# Patient Record
Sex: Female | Born: 1995 | Race: White | Hispanic: No | Marital: Single | Smoking: Never smoker
Health system: Southern US, Community
[De-identification: ages and names within clinical notes are randomized; demographics above are authoritative.]

## PROBLEM LIST (undated history)

## (undated) DIAGNOSIS — R519 Headache, unspecified: Secondary | ICD-10-CM

## (undated) DIAGNOSIS — Z8669 Personal history of other diseases of the nervous system and sense organs: Secondary | ICD-10-CM

## (undated) DIAGNOSIS — E119 Type 2 diabetes mellitus without complications: Secondary | ICD-10-CM

## (undated) DIAGNOSIS — Z87440 Personal history of urinary (tract) infections: Secondary | ICD-10-CM

## (undated) DIAGNOSIS — Z8619 Personal history of other infectious and parasitic diseases: Secondary | ICD-10-CM

## (undated) DIAGNOSIS — F32A Depression, unspecified: Secondary | ICD-10-CM

## (undated) DIAGNOSIS — R51 Headache: Secondary | ICD-10-CM

## (undated) DIAGNOSIS — F329 Major depressive disorder, single episode, unspecified: Secondary | ICD-10-CM

## (undated) DIAGNOSIS — S4990XA Unspecified injury of shoulder and upper arm, unspecified arm, initial encounter: Secondary | ICD-10-CM

## (undated) HISTORY — DX: Major depressive disorder, single episode, unspecified: F32.9

## (undated) HISTORY — DX: Personal history of urinary (tract) infections: Z87.440

## (undated) HISTORY — DX: Headache: R51

## (undated) HISTORY — DX: Personal history of other infectious and parasitic diseases: Z86.19

## (undated) HISTORY — DX: Type 2 diabetes mellitus without complications: E11.9

## (undated) HISTORY — DX: Headache, unspecified: R51.9

## (undated) HISTORY — DX: Depression, unspecified: F32.A

## (undated) HISTORY — DX: Personal history of other diseases of the nervous system and sense organs: Z86.69

## (undated) HISTORY — DX: Unspecified injury of shoulder and upper arm, unspecified arm, initial encounter: S49.90XA

---
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
## 2014-04-02 ENCOUNTER — Ambulatory Visit (INDEPENDENT_AMBULATORY_CARE_PROVIDER_SITE_OTHER): Payer: BC Managed Care – PPO | Admitting: Internal Medicine

## 2014-04-02 ENCOUNTER — Encounter (INDEPENDENT_AMBULATORY_CARE_PROVIDER_SITE_OTHER): Payer: Self-pay

## 2014-04-02 ENCOUNTER — Encounter: Payer: Self-pay | Admitting: Internal Medicine

## 2014-04-02 VITALS — BP 110/60 | HR 85 | Temp 98.7°F | Ht 63.7 in | Wt 178.5 lb

## 2014-04-02 DIAGNOSIS — F4323 Adjustment disorder with mixed anxiety and depressed mood: Secondary | ICD-10-CM

## 2014-04-02 DIAGNOSIS — E109 Type 1 diabetes mellitus without complications: Secondary | ICD-10-CM

## 2014-04-02 MED ORDER — SERTRALINE HCL 50 MG PO TABS
50.0000 mg | ORAL_TABLET | Freq: Every day | ORAL | Status: AC
Start: 2014-04-02 — End: ?

## 2014-04-02 MED ORDER — ALPRAZOLAM 0.25 MG PO TABS: 0.2500 mg | ORAL_TABLET | Freq: Three times a day (TID) | ORAL | Status: AC | PRN

## 2014-04-02 NOTE — Assessment & Plan Note (Signed)
Symptoms most consistent with adjustment disorder, however we discussed PTSD given her history of experiencing her sister's life threatening diagnosis, and now her mother's diagnosis. Will restart Sertraline. Will also add prn low dose of alprazolam. We discussed potential risks of this medication. She will not take alprazolam when driving. She will follow [facility] counseling [facility] [employer]. She will call or RTC if any worsening symptoms or thoughts of suicide. Follow up in 4 weeks and prn.

## 2014-04-02 NOTE — Patient Instructions (Signed)
Start Sertraline 25mg  daily x 1 week, then increase to 50mg  daily.  Use Alprazolam 0.25mg  up to three times daily as needed for anxiety. Do not drive while taking this medication.  Follow up in 4 weeks or sooner as needed.

## 2014-04-02 NOTE — Progress Notes (Signed)
Pre visit review using our clinic review tool, if applicable. No additional management support is needed unless otherwise documented below in the visit note. 

## 2014-04-02 NOTE — Assessment & Plan Note (Signed)
[facility]. She is scheduled to have repeat A1c in 04/2014 with her endocrinologist. Will follow. Continue Lantus and Novolog.

## 2014-04-02 NOTE — Progress Notes (Signed)
Subjective:    Patient ID: Suzanne Mcdaniel, female    DOB: 1996/06/10, 18 y.o.   MRN: [id number]  HPI 18YO female presents to establish care.  DM - Type 1, diagnosed in 2010. Initially started after GI illness. Developed vomiting and labored breathing. [facility] to [facility], BG was 680. Started on insulin drip in ICU. Now transitioned to Lantus 30u daily with Novolog prior to meals. Tolerating well. [facility], however at that time dose of Lantus was increased. [facility].  Not sleeping well. 2-3hr per night. Reports increased anxiety and depressed mood. In the past, had some increased anxiety and depressed mood when going back to school after her sister's diagnosis of leukemia. Took Sertraline at that time with some improvement in symptoms. [facility]. Plans to follow with college counselor weekly when back at school. Denies suicidal ideation. Having trouble coping with her mother's recent diagnosis of colorectal cancer. 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 Would like to restart Sertraline and also add something to use prn for severe episodes of panic.  Review of Systems  Constitutional: Negative for fever, chills, appetite change, fatigue and unexpected weight change.  HENT: Negative for congestion, ear pain, sinus pressure, sore throat, trouble swallowing and voice change.   Eyes: Negative for visual disturbance.  Respiratory: Negative for cough, shortness of breath, wheezing and stridor.   Cardiovascular: Negative for chest pain, palpitations and leg swelling.  Gastrointestinal: Negative for nausea, vomiting, abdominal pain, diarrhea, constipation, blood in stool, abdominal distention and anal bleeding.  Genitourinary: Negative for dysuria and flank pain.  Musculoskeletal: Negative for arthralgias, gait problem, myalgias and neck pain.  Skin: Negative for color change and  rash.  Neurological: Negative for dizziness and headaches.  Hematological: Negative for adenopathy. Does not bruise/bleed easily.  Psychiatric/Behavioral: Positive for sleep disturbance and dysphoric mood. Negative for suicidal ideas. The patient is nervous/anxious.        Objective:    BP 110/60  Pulse 85  Temp(Src) 98.7 F (37.1 C) (Oral)  Ht 5' 3.7" (1.618 m)  Wt 178 lb 8 oz (80.967 kg)  BMI 30.93 kg/m2  SpO2 98%  LMP 03/28/2014 Physical Exam  Constitutional: She is oriented to person, place, and time. She appears well-developed and well-nourished. No distress.  HENT:  Head: Normocephalic and atraumatic.  Right Ear: External ear normal.  Left Ear: External ear normal.  Nose: Nose normal.  Mouth/Throat: Oropharynx is clear and moist. No oropharyngeal exudate.  Eyes: Conjunctivae are normal. Pupils are equal, round, and reactive to light. Right eye exhibits no discharge. Left eye exhibits no discharge. No scleral icterus.  Neck: Normal range of motion. Neck supple. No tracheal deviation present. No thyromegaly present.  Cardiovascular: Normal rate, regular rhythm, normal heart sounds and intact distal pulses.  Exam reveals no gallop and no friction rub.   No murmur heard. Pulmonary/Chest: Effort normal and breath sounds normal. No accessory muscle usage. Not tachypneic. No respiratory distress. She has no decreased breath sounds. She has no wheezes. She has no rhonchi. She has no rales. She exhibits no tenderness.  Abdominal: Soft. Bowel sounds are normal. She exhibits no distension and no mass. There is no tenderness. There is no rebound and no guarding.  Musculoskeletal: Normal range of motion. She exhibits no edema and no tenderness.  Lymphadenopathy:    She has no cervical adenopathy.  Neurological: She is alert and oriented to person, place, and time. No cranial nerve deficit. She exhibits normal muscle tone. Coordination normal.  Skin: Skin is warm and dry. No rash noted.  She is not diaphoretic. No erythema. No pallor.  Psychiatric: Her speech is normal and behavior is normal. Judgment and thought content normal. Her mood appears anxious. Cognition and memory are normal. She exhibits a depressed mood. She expresses no suicidal ideation. She expresses no suicidal plans.          Assessment & Plan:   Problem List Items Addressed This Visit     Unprioritized   Adjustment disorder with mixed anxiety and depressed mood - Primary     Symptoms most consistent with adjustment disorder, however we discussed PTSD given her history of experiencing her sister's life threatening diagnosis, and now her mother's diagnosis. Will restart Sertraline. Will also add prn low dose of alprazolam. We discussed potential risks of this medication. She will not take alprazolam when driving. She will follow [facility] counseling [facility] [employer]. She will call or RTC if any worsening symptoms or thoughts of suicide. Follow up in 4 weeks and prn.    Relevant Medications      sertraline (ZOLOFT) tablet      ALPRAZolam  (XANAX) tablet   Insulin dependent type 1 diabetes mellitus     [facility]. She is scheduled to have repeat A1c in 04/2014 with her endocrinologist. Will follow. Continue Lantus and Novolog.    Relevant Medications      insulin aspart (NOVOLOG) 100 UNIT/ML injection      Insulin Glargine (LANTUS SOLOSTAR) 100 UNIT/ML Solostar Pen       Return in about 4 weeks (around 04/30/2014).

## 2014-05-03 ENCOUNTER — Ambulatory Visit: Payer: Self-pay | Admitting: Adult Health

## 2014-05-14 LAB — HM DIABETES EYE EXAM

## 2014-05-15 ENCOUNTER — Encounter: Payer: Self-pay | Admitting: *Deleted
